# Patient Record
Sex: Female | Born: 1975 | Race: Black or African American | Hispanic: No | Marital: Single | Smoking: Never smoker
Health system: Southern US, Community
[De-identification: ages and names within clinical notes are randomized; demographics above are authoritative.]

## PROBLEM LIST (undated history)

## (undated) HISTORY — PX: CHOLECYSTECTOMY: SHX55

## (undated) HISTORY — PX: HERNIA REPAIR: SHX51

## (undated) HISTORY — PX: COSMETIC SURGERY: SHX468

---

## 2004-12-02 ENCOUNTER — Emergency Department: Payer: Self-pay | Admitting: Emergency Medicine

## 2004-12-12 ENCOUNTER — Emergency Department: Payer: Self-pay | Admitting: General Practice

## 2007-06-19 ENCOUNTER — Emergency Department: Payer: Self-pay

## 2007-06-23 ENCOUNTER — Ambulatory Visit: Payer: Self-pay

## 2007-07-02 ENCOUNTER — Ambulatory Visit: Payer: Self-pay | Admitting: Obstetrics & Gynecology

## 2007-11-01 ENCOUNTER — Emergency Department: Payer: Self-pay | Admitting: Unknown Physician Specialty

## 2007-11-01 ENCOUNTER — Other Ambulatory Visit: Payer: Self-pay

## 2008-01-24 ENCOUNTER — Emergency Department: Payer: Self-pay | Admitting: Emergency Medicine

## 2008-02-25 ENCOUNTER — Observation Stay: Payer: Self-pay

## 2008-03-14 ENCOUNTER — Emergency Department: Payer: Self-pay | Admitting: Emergency Medicine

## 2008-03-28 ENCOUNTER — Observation Stay: Payer: Self-pay

## 2008-04-07 ENCOUNTER — Observation Stay: Payer: Self-pay | Admitting: Obstetrics and Gynecology

## 2008-04-18 ENCOUNTER — Observation Stay: Payer: Self-pay | Admitting: Obstetrics and Gynecology

## 2008-04-23 ENCOUNTER — Inpatient Hospital Stay: Payer: Self-pay | Admitting: Obstetrics and Gynecology

## 2009-07-20 ENCOUNTER — Ambulatory Visit: Payer: Self-pay | Admitting: Surgery

## 2009-07-30 ENCOUNTER — Ambulatory Visit: Payer: Self-pay | Admitting: Surgery

## 2013-05-19 ENCOUNTER — Emergency Department: Payer: Self-pay | Admitting: Emergency Medicine

## 2013-05-19 LAB — RAPID INFLUENZA A&B ANTIGENS

## 2017-10-10 ENCOUNTER — Other Ambulatory Visit: Payer: Self-pay | Admitting: Family Medicine

## 2017-10-10 DIAGNOSIS — Z1231 Encounter for screening mammogram for malignant neoplasm of breast: Secondary | ICD-10-CM

## 2017-10-24 ENCOUNTER — Ambulatory Visit: Payer: Self-pay

## 2017-10-24 ENCOUNTER — Ambulatory Visit
Admission: RE | Admit: 2017-10-24 | Discharge: 2017-10-24 | Disposition: A | Discharge status: Home / Self Care | Payer: Medicaid Other | Source: Ambulatory Visit | Attending: Family Medicine | Admitting: Family Medicine

## 2017-10-24 DIAGNOSIS — Z1231 Encounter for screening mammogram for malignant neoplasm of breast: Secondary | ICD-10-CM | POA: Diagnosis not present

## 2019-03-20 DIAGNOSIS — H52223 Regular astigmatism, bilateral: Secondary | ICD-10-CM | POA: Diagnosis not present

## 2019-04-02 DIAGNOSIS — Z124 Encounter for screening for malignant neoplasm of cervix: Secondary | ICD-10-CM | POA: Diagnosis not present

## 2019-04-02 DIAGNOSIS — Z975 Presence of (intrauterine) contraceptive device: Secondary | ICD-10-CM | POA: Diagnosis not present

## 2019-04-02 DIAGNOSIS — R5383 Other fatigue: Secondary | ICD-10-CM | POA: Diagnosis not present

## 2019-04-02 DIAGNOSIS — Z0000 Encounter for general adult medical examination without abnormal findings: Secondary | ICD-10-CM | POA: Diagnosis not present

## 2019-04-02 DIAGNOSIS — Z1231 Encounter for screening mammogram for malignant neoplasm of breast: Secondary | ICD-10-CM | POA: Diagnosis not present

## 2019-04-02 DIAGNOSIS — Z114 Encounter for screening for human immunodeficiency virus [HIV]: Secondary | ICD-10-CM | POA: Diagnosis not present

## 2019-04-02 DIAGNOSIS — Z1159 Encounter for screening for other viral diseases: Secondary | ICD-10-CM | POA: Diagnosis not present

## 2019-04-07 DIAGNOSIS — Z419 Encounter for procedure for purposes other than remedying health state, unspecified: Secondary | ICD-10-CM | POA: Diagnosis not present

## 2019-04-14 ENCOUNTER — Other Ambulatory Visit: Payer: Self-pay | Admitting: Family Medicine

## 2019-04-14 DIAGNOSIS — Z1231 Encounter for screening mammogram for malignant neoplasm of breast: Secondary | ICD-10-CM

## 2019-04-15 ENCOUNTER — Other Ambulatory Visit: Payer: Self-pay

## 2019-04-15 ENCOUNTER — Ambulatory Visit
Admission: RE | Admit: 2019-04-15 | Discharge: 2019-04-15 | Disposition: A | Discharge status: Home / Self Care | Payer: 59 | Source: Ambulatory Visit | Attending: Family Medicine | Admitting: Family Medicine

## 2019-04-15 DIAGNOSIS — Z1231 Encounter for screening mammogram for malignant neoplasm of breast: Secondary | ICD-10-CM | POA: Insufficient documentation

## 2019-04-18 DIAGNOSIS — Z20828 Contact with and (suspected) exposure to other viral communicable diseases: Secondary | ICD-10-CM | POA: Diagnosis not present

## 2019-05-14 DIAGNOSIS — L7633 Postprocedural seroma of skin and subcutaneous tissue following a dermatologic procedure: Secondary | ICD-10-CM | POA: Diagnosis not present

## 2019-05-14 DIAGNOSIS — S31109A Unspecified open wound of abdominal wall, unspecified quadrant without penetration into peritoneal cavity, initial encounter: Secondary | ICD-10-CM | POA: Diagnosis not present

## 2019-05-14 DIAGNOSIS — R109 Unspecified abdominal pain: Secondary | ICD-10-CM | POA: Diagnosis not present

## 2019-05-14 DIAGNOSIS — L7634 Postprocedural seroma of skin and subcutaneous tissue following other procedure: Secondary | ICD-10-CM | POA: Diagnosis not present

## 2019-05-14 DIAGNOSIS — T8131XA Disruption of external operation (surgical) wound, not elsewhere classified, initial encounter: Secondary | ICD-10-CM | POA: Diagnosis not present

## 2019-05-18 ENCOUNTER — Encounter: Payer: Self-pay | Admitting: Emergency Medicine

## 2019-05-18 ENCOUNTER — Other Ambulatory Visit: Payer: Self-pay

## 2019-05-18 ENCOUNTER — Ambulatory Visit
Admission: EM | Admit: 2019-05-18 | Discharge: 2019-05-18 | Disposition: A | Discharge status: Home / Self Care | Payer: 59 | Attending: Family Medicine | Admitting: Family Medicine

## 2019-05-18 DIAGNOSIS — R059 Cough, unspecified: Secondary | ICD-10-CM

## 2019-05-18 DIAGNOSIS — Z825 Family history of asthma and other chronic lower respiratory diseases: Secondary | ICD-10-CM | POA: Insufficient documentation

## 2019-05-18 DIAGNOSIS — R05 Cough: Secondary | ICD-10-CM | POA: Insufficient documentation

## 2019-05-18 DIAGNOSIS — U071 COVID-19: Secondary | ICD-10-CM | POA: Insufficient documentation

## 2019-05-18 DIAGNOSIS — R52 Pain, unspecified: Secondary | ICD-10-CM | POA: Insufficient documentation

## 2019-05-18 DIAGNOSIS — R509 Fever, unspecified: Secondary | ICD-10-CM | POA: Insufficient documentation

## 2019-05-18 DIAGNOSIS — R5383 Other fatigue: Secondary | ICD-10-CM | POA: Diagnosis not present

## 2019-05-18 DIAGNOSIS — Z8249 Family history of ischemic heart disease and other diseases of the circulatory system: Secondary | ICD-10-CM | POA: Insufficient documentation

## 2019-05-18 DIAGNOSIS — R519 Headache, unspecified: Secondary | ICD-10-CM

## 2019-05-18 DIAGNOSIS — Z833 Family history of diabetes mellitus: Secondary | ICD-10-CM | POA: Insufficient documentation

## 2019-05-18 DIAGNOSIS — M791 Myalgia, unspecified site: Secondary | ICD-10-CM

## 2019-05-18 DIAGNOSIS — R0981 Nasal congestion: Secondary | ICD-10-CM

## 2019-05-18 DIAGNOSIS — J029 Acute pharyngitis, unspecified: Secondary | ICD-10-CM

## 2019-05-18 NOTE — ED Triage Notes (Addendum)
Patient in today c/o cough, nausea, headache, body aches and fever (100) x 1 day. Patient's last dose of Tylenol was ~6:45am today. Patient's last covid test (through work) was 5 days ago and it was negative.

## 2019-05-18 NOTE — ED Provider Notes (Signed)
[facility]    CSN: [id number] Arrival date & time: 05/18/19  1153      History   Chief Complaint Chief Complaint  Patient presents with  . Cough    APPT  . Fever  . Nausea    HPI Sarah Wilson is a 44 y.o. female.   44 year old female presents with body aches, nausea, headache, chills and low grade fever that started yesterday. Also having a dry cough. Minimal nasal congestion. No vomiting or diarrhea. Recently traveled to [place] to have an abdominoplasty (tummy tuck). Was seen at [facility] ER on 05/14/2019 ( 5 days ago) for concern over drainage from incision. Various tests along with CT Scans were performed and determined that no infection was present. Incision healing better now. Was tested for COVID 19 5 days ago through work which was negative but concerned over possible exposure at the ER. Has been taking Tylenol with some relief.  Other chronic health issues include environmental allergies and takes Zyrtec daily. Also on Mirena IUD and takes daily vitamins.   The history is provided by the patient.    History reviewed. No pertinent past medical history.  There are no problems to display for this patient.   Past Surgical History:  Procedure Laterality Date  . CHOLECYSTECTOMY    . COSMETIC SURGERY     tummy tuck  . HERNIA REPAIR      OB History   No obstetric history on file.      Home Medications    Prior to Admission medications   Medication Sig Start Date End Date Taking? Authorizing Provider  Cetirizine HCl 10 MG TBDP Take by mouth. 03/14/12  Yes [provider]  levonorgestrel Toney Reil) 20 MCG/24HR IUD placed in 05/2008 08/11/10  Yes [provider]    Family History Family History  Problem Relation Age of Onset  . Breast cancer Maternal Grandmother 60  . Asthma Mother   . Diabetes Mother   . Hypertension Father     Social History Social History   Tobacco Use  . Smoking status: Never Smoker  . Smokeless  tobacco: Never Used  Substance Use Topics  . Alcohol use: Yes    Comment: ocassional  . Drug use: Never     Allergies   Tape   Review of Systems Review of Systems  Constitutional: Positive for appetite change, chills, fatigue and fever. Negative for activity change and diaphoresis.  HENT: Positive for congestion, postnasal drip, rhinorrhea and sore throat (irritated). Negative for ear discharge, ear pain, facial swelling, mouth sores, nosebleeds, sinus pressure, sinus pain and trouble swallowing.   Eyes: Negative for pain, discharge, redness and itching.  Respiratory: Positive for cough. Negative for chest tightness, shortness of breath and wheezing.   Gastrointestinal: Positive for nausea. Negative for abdominal pain, diarrhea and vomiting.  Musculoskeletal: Positive for arthralgias and myalgias. Negative for neck pain and neck stiffness.  Skin: Positive for wound (healing). Negative for color change and rash.  Allergic/Immunologic: Positive for environmental allergies. Negative for food allergies.  Neurological: Positive for headaches. Negative for dizziness, tremors, seizures, syncope, weakness, light-headedness and numbness.  Hematological: Negative for adenopathy. Does not bruise/bleed easily.     Physical Exam Triage Vital Signs ED Triage Vitals  Enc Vitals Group     BP 05/18/19 1204 (!) 147/90     Pulse Rate 05/18/19 1204 95     Resp 05/18/19 1204 18     Temp 05/18/19 1204 99.5 F (37.5 C)  Temp Source 05/18/19 1204 Oral     SpO2 05/18/19 1204 100 %     Weight 05/18/19 1205 175 lb (79.4 kg)     Height 05/18/19 1205 5\' 4"  (1.626 m)     Head Circumference --      Peak Flow --      Pain Score 05/18/19 1204 4     Pain Loc --      Pain Edu? --      Excl. in [place]? --    No data found.  Updated Vital Signs BP (!) 147/90 (BP Location: Left Arm)   Pulse 95   Temp 99.5 F (37.5 C) (Oral)   Resp 18   Ht 5\' 4"  (1.626 m)   Wt 175 lb (79.4 kg)   SpO2 100%   BMI  30.04 kg/m   Visual Acuity Right Eye Distance:   Left Eye Distance:   Bilateral Distance:    Right Eye Near:   Left Eye Near:    Bilateral Near:     Physical Exam Vitals and nursing note reviewed.  Constitutional:      General: She is awake. She is not in acute distress.    Appearance: She is well-developed and well-groomed. She is ill-appearing.     Comments: Patient sitting comfortably in exam chair but appears ill.   HENT:     Head: Normocephalic and atraumatic.     Right Ear: Hearing, tympanic membrane, ear canal and external ear normal.     Left Ear: Hearing, tympanic membrane, ear canal and external ear normal.     Nose: Congestion and rhinorrhea present. Rhinorrhea is clear.     Right Sinus: No maxillary sinus tenderness or frontal sinus tenderness.     Left Sinus: No maxillary sinus tenderness or frontal sinus tenderness.     Mouth/Throat:     Lips: Pink.     Mouth: Mucous membranes are moist.     Pharynx: Oropharynx is clear. Uvula midline. Posterior oropharyngeal erythema present. No pharyngeal swelling, oropharyngeal exudate or uvula swelling.  Eyes:     Extraocular Movements: Extraocular movements intact.     Conjunctiva/sclera: Conjunctivae normal.  Cardiovascular:     Rate and Rhythm: Normal rate and regular rhythm.     Heart sounds: Normal heart sounds. No murmur.  Pulmonary:     Effort: Pulmonary effort is normal. No respiratory distress.     Breath sounds: Normal breath sounds and air entry. No decreased air movement. No decreased breath sounds, wheezing, rhonchi or rales.  Abdominal:     General: Abdomen is flat. Bowel sounds are normal.     Palpations: Abdomen is soft.     Comments: Patient had abdominal band in place- did not remove to evaluate previous wound since patient indicated that wound was improving with minimal pain.   Musculoskeletal:        General: Normal range of motion.     Cervical back: Normal range of motion and neck supple. No  rigidity.  Lymphadenopathy:     Cervical: No cervical adenopathy.  Skin:    General: Skin is warm and dry.     Capillary Refill: Capillary refill takes less than 2 seconds.  Neurological:     General: No focal deficit present.     Mental Status: She is alert and oriented to person, place, and time.  Psychiatric:        Mood and Affect: Mood normal.        Behavior: Behavior normal. Behavior is  cooperative.        Thought Content: Thought content normal.        Judgment: Judgment normal.      UC Treatments / Results  Labs (all labs ordered are listed, but only abnormal results are displayed) Labs Reviewed  NOVEL CORONAVIRUS, NAA (HOSP ORDER, SEND-OUT TO REF LAB; TAT 18-24 HRS)    EKG   Radiology No results found.  Procedures Procedures (including critical care time)  Medications Ordered in UC Medications - No data to display  Initial Impression / Assessment and Plan / UC Course  I have reviewed the triage vital signs and the nursing notes.  Pertinent labs & imaging results that were available during my care of the patient were reviewed by me and considered in my medical decision making (see chart for details).    Discussed with patient that she probably has a viral illness. Do not believe she is septic from previous wound, especially since cough is main concern and her wound is healing and abdominal pain is minimal. Will continue to monitor symptoms. Recommend continue Tylenol 1000mg  every 8 hours as needed for fever and body aches. May alternate with Ibuprofen 600mg  every 8 to 12 hours as needed. Continue to push fluids. Rest. Stay at home. Follow-up pending COVID 19 test results or go back to the ER if symptoms worsen.   Final Clinical Impressions(s) / UC Diagnoses   Final diagnoses:  Generalized body aches  Cough  Chills with fever     Discharge Instructions     Recommend continue Tylenol 1000mg  every 8 hours as needed for fever and body aches. May also take  Ibuprofen 600mg  every 8 to 12 hours as needed. Continue to push fluids. Rest. Stay at home. Follow-up pending COVID 19 test results.     ED Prescriptions    None     PDMP not reviewed this encounter.   , NP 05/19/19 1042

## 2019-05-18 NOTE — Discharge Instructions (Addendum)
Recommend continue Tylenol 1000mg  every 8 hours as needed for fever and body aches. May also take Ibuprofen 600mg  every 8 to 12 hours as needed. Continue to push fluids. Rest. Stay at home. Follow-up pending COVID 19 test results.

## 2019-05-20 ENCOUNTER — Telehealth: Payer: Self-pay | Admitting: Emergency Medicine

## 2019-05-20 NOTE — Telephone Encounter (Signed)

## 2019-05-21 LAB — NOVEL CORONAVIRUS, NAA (HOSP ORDER, SEND-OUT TO REF LAB; TAT 18-24 HRS): SARS-CoV-2, NAA: DETECTED — AB

## 2019-05-27 DIAGNOSIS — Z6830 Body mass index (BMI) 30.0-30.9, adult: Secondary | ICD-10-CM | POA: Diagnosis not present

## 2019-05-27 DIAGNOSIS — T8130XA Disruption of wound, unspecified, initial encounter: Secondary | ICD-10-CM | POA: Diagnosis not present

## 2019-05-27 DIAGNOSIS — L7634 Postprocedural seroma of skin and subcutaneous tissue following other procedure: Secondary | ICD-10-CM | POA: Diagnosis not present

## 2019-06-19 DIAGNOSIS — Z113 Encounter for screening for infections with a predominantly sexual mode of transmission: Secondary | ICD-10-CM | POA: Diagnosis not present

## 2019-06-19 DIAGNOSIS — Z30430 Encounter for insertion of intrauterine contraceptive device: Secondary | ICD-10-CM | POA: Diagnosis not present

## 2019-08-18 DIAGNOSIS — Z20828 Contact with and (suspected) exposure to other viral communicable diseases: Secondary | ICD-10-CM | POA: Diagnosis not present

## 2019-08-25 DIAGNOSIS — Z20828 Contact with and (suspected) exposure to other viral communicable diseases: Secondary | ICD-10-CM | POA: Diagnosis not present

## 2019-09-01 DIAGNOSIS — Z20828 Contact with and (suspected) exposure to other viral communicable diseases: Secondary | ICD-10-CM | POA: Diagnosis not present

## 2019-09-08 DIAGNOSIS — Z20828 Contact with and (suspected) exposure to other viral communicable diseases: Secondary | ICD-10-CM | POA: Diagnosis not present

## 2019-09-15 DIAGNOSIS — Z20828 Contact with and (suspected) exposure to other viral communicable diseases: Secondary | ICD-10-CM | POA: Diagnosis not present

## 2019-10-07 ENCOUNTER — Ambulatory Visit: Payer: 59 | Attending: Internal Medicine

## 2019-10-07 DIAGNOSIS — Z23 Encounter for immunization: Secondary | ICD-10-CM

## 2019-10-07 NOTE — Progress Notes (Signed)
   Covid-19 Vaccination Clinic  Name:  Sarah Wilson    MRN: [id number] DOB: 1975/08/18  10/07/2019  Ms. Sarah Wilson was observed post Covid-19 immunization for 15 minutes without incident. She was provided with Vaccine Information Sheet and instruction to access the V-Safe system.   Ms. Sarah Wilson was instructed to call 911 with any severe reactions post vaccine: Marland Kitchen Difficulty breathing  . Swelling of face and throat  . A fast heartbeat  . A bad rash all over body  . Dizziness and weakness   Immunizations Administered    Name Date Dose VIS Date Route   Pfizer COVID-19 Vaccine 10/07/2019  8:50 AM 0.3 mL 07/09/2018 Intramuscular   Manufacturer: [employer], [employer]   Lot: [id number]   NDC: [address]-1

## 2019-10-28 ENCOUNTER — Ambulatory Visit: Payer: 59 | Attending: Internal Medicine

## 2019-10-28 DIAGNOSIS — Z23 Encounter for immunization: Secondary | ICD-10-CM

## 2019-10-28 NOTE — Progress Notes (Signed)
   Covid-19 Vaccination Clinic  Name:  Sarah Wilson    MRN: [id number] DOB: [birth year]  10/28/2019  Sarah Wilson was observed post Covid-19 immunization for 15 minutes without incident. She was provided with Vaccine Information Sheet and instruction to access the V-Safe system.   Sarah Wilson was instructed to call 911 with any severe reactions post vaccine: Marland Kitchen Difficulty breathing  . Swelling of face and throat  . A fast heartbeat  . A bad rash all over body  . Dizziness and weakness   Immunizations Administered    Name Date Dose VIS Date Route   Pfizer COVID-19 Vaccine 10/28/2019  8:27 AM 0.3 mL 07/09/2018 Intramuscular   Manufacturer: [employer], [employer]   Lot: [id number]   NDC: [address]-4

## 2019-11-24 DIAGNOSIS — Z6832 Body mass index (BMI) 32.0-32.9, adult: Secondary | ICD-10-CM | POA: Diagnosis not present

## 2019-11-24 DIAGNOSIS — Z01812 Encounter for preprocedural laboratory examination: Secondary | ICD-10-CM | POA: Diagnosis not present

## 2019-11-24 DIAGNOSIS — R001 Bradycardia, unspecified: Secondary | ICD-10-CM | POA: Diagnosis not present

## 2019-11-24 DIAGNOSIS — Z419 Encounter for procedure for purposes other than remedying health state, unspecified: Secondary | ICD-10-CM | POA: Diagnosis not present

## 2019-11-24 DIAGNOSIS — Z20822 Contact with and (suspected) exposure to covid-19: Secondary | ICD-10-CM | POA: Diagnosis not present

## 2019-11-24 DIAGNOSIS — Z114 Encounter for screening for human immunodeficiency virus [HIV]: Secondary | ICD-10-CM | POA: Diagnosis not present

## 2019-12-11 DIAGNOSIS — Z20822 Contact with and (suspected) exposure to covid-19: Secondary | ICD-10-CM | POA: Diagnosis not present

## 2020-07-02 ENCOUNTER — Other Ambulatory Visit: Payer: Self-pay | Admitting: Family Medicine

## 2020-07-02 DIAGNOSIS — Z1231 Encounter for screening mammogram for malignant neoplasm of breast: Secondary | ICD-10-CM

## 2020-07-21 ENCOUNTER — Ambulatory Visit
Admission: RE | Admit: 2020-07-21 | Discharge: 2020-07-21 | Disposition: A | Discharge status: Home / Self Care | Payer: 59 | Source: Ambulatory Visit | Attending: Family Medicine | Admitting: Family Medicine

## 2020-07-21 ENCOUNTER — Other Ambulatory Visit: Payer: Self-pay

## 2020-07-21 DIAGNOSIS — Z1231 Encounter for screening mammogram for malignant neoplasm of breast: Secondary | ICD-10-CM | POA: Insufficient documentation

## 2020-11-17 DIAGNOSIS — R001 Bradycardia, unspecified: Secondary | ICD-10-CM | POA: Diagnosis not present

## 2020-11-17 DIAGNOSIS — Z0000 Encounter for general adult medical examination without abnormal findings: Secondary | ICD-10-CM | POA: Diagnosis not present

## 2020-11-17 DIAGNOSIS — Z419 Encounter for procedure for purposes other than remedying health state, unspecified: Secondary | ICD-10-CM | POA: Diagnosis not present

## 2020-11-23 DIAGNOSIS — Z419 Encounter for procedure for purposes other than remedying health state, unspecified: Secondary | ICD-10-CM | POA: Diagnosis not present

## 2020-12-04 DIAGNOSIS — Z20822 Contact with and (suspected) exposure to covid-19: Secondary | ICD-10-CM | POA: Diagnosis not present

## 2021-03-22 DIAGNOSIS — J40 Bronchitis, not specified as acute or chronic: Secondary | ICD-10-CM | POA: Diagnosis not present

## 2021-03-22 DIAGNOSIS — R6889 Other general symptoms and signs: Secondary | ICD-10-CM | POA: Diagnosis not present

## 2021-03-22 DIAGNOSIS — J029 Acute pharyngitis, unspecified: Secondary | ICD-10-CM | POA: Diagnosis not present

## 2021-09-30 ENCOUNTER — Ambulatory Visit (LOCAL_COMMUNITY_HEALTH_CENTER): Payer: Self-pay

## 2021-09-30 DIAGNOSIS — Z111 Encounter for screening for respiratory tuberculosis: Secondary | ICD-10-CM

## 2021-10-03 ENCOUNTER — Ambulatory Visit (LOCAL_COMMUNITY_HEALTH_CENTER): Payer: 59

## 2021-10-03 DIAGNOSIS — Z111 Encounter for screening for respiratory tuberculosis: Secondary | ICD-10-CM

## 2021-10-03 LAB — TB SKIN TEST
Induration: 0 mm
TB Skin Test: NEGATIVE

## 2021-10-07 ENCOUNTER — Other Ambulatory Visit: Payer: 59

## 2021-10-18 IMAGING — MG MM DIGITAL SCREENING BILAT W/ TOMO AND CAD
8 series · 9 of 24 positions shown · non-contrast
Comparison: Previous exam(s).

CLINICAL DATA: Screening.

EXAM:
DIGITAL SCREENING BILATERAL MAMMOGRAM WITH TOMOSYNTHESIS AND CAD
TECHNIQUE: Bilateral screening digital craniocaudal and mediolateral oblique
mammograms were obtained. Bilateral screening digital breast
tomosynthesis was performed. The images were evaluated with
computer-aided detection.

[L CC synth-2D]
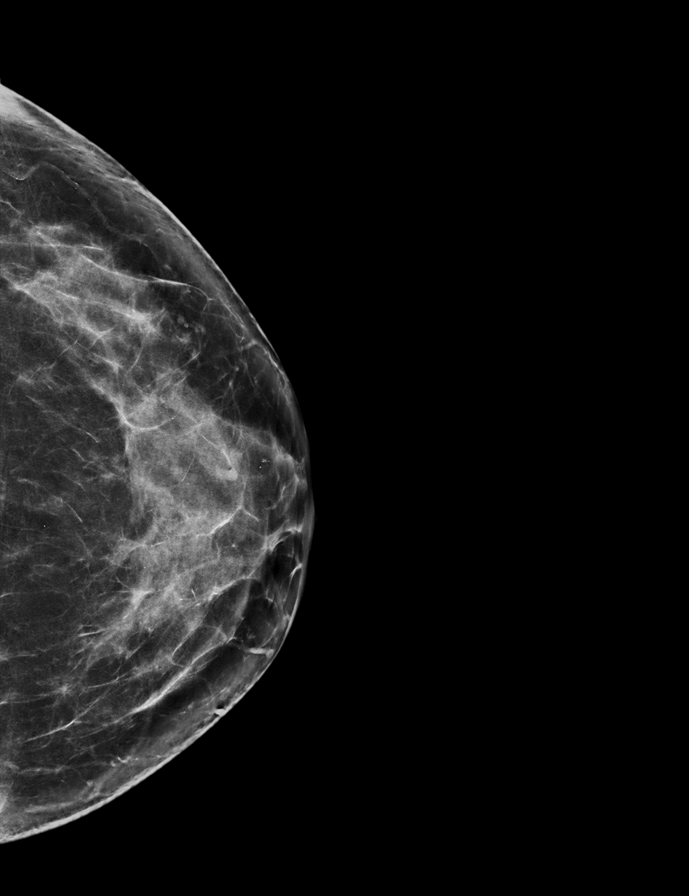

[L MLO synth-2D]
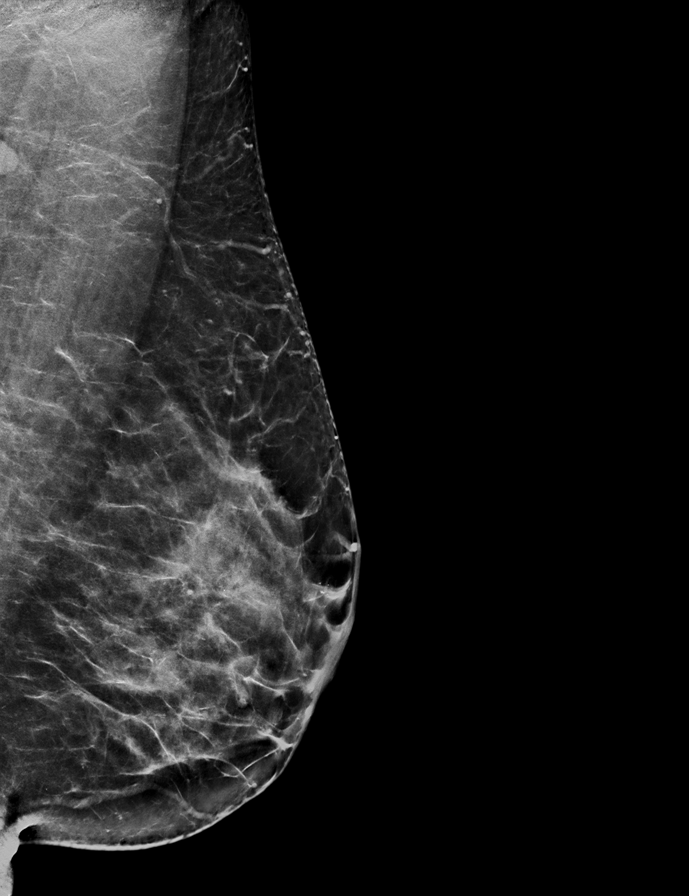

[R CC synth-2D]
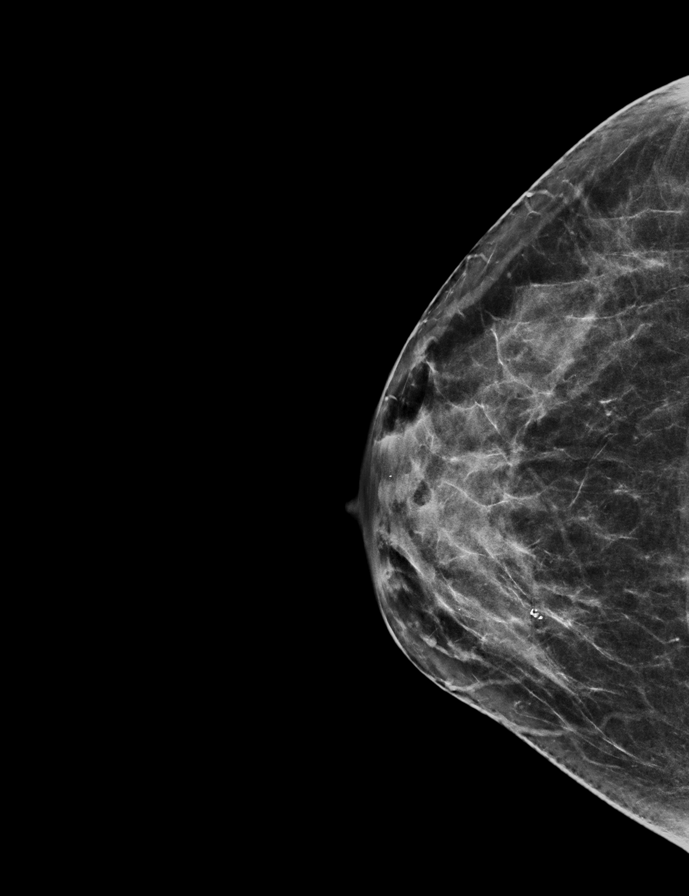

[R MLO synth-2D]
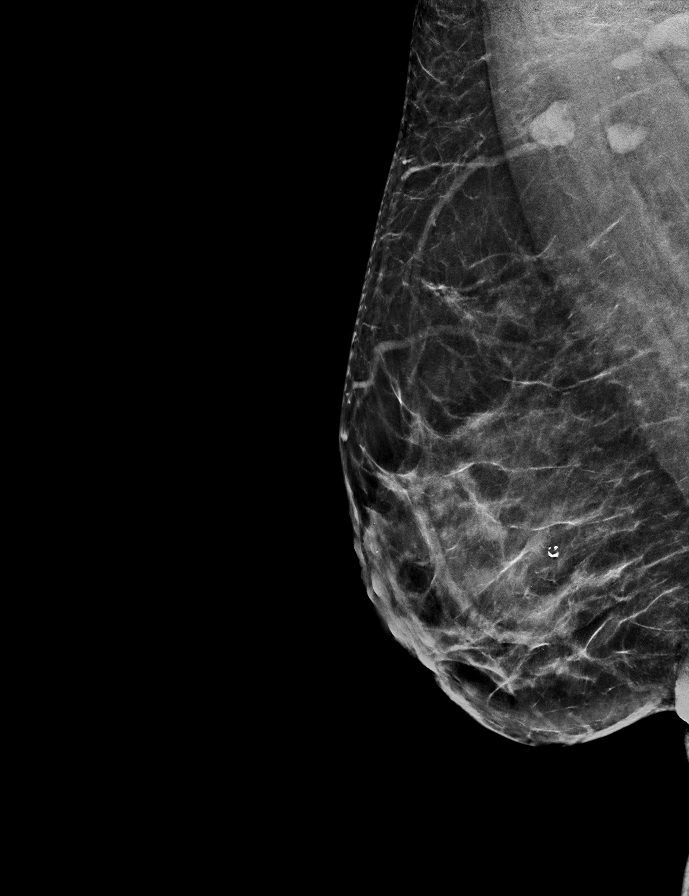

[R MLO tomo · 2 of 72 frames shown]
[frame 24/72]
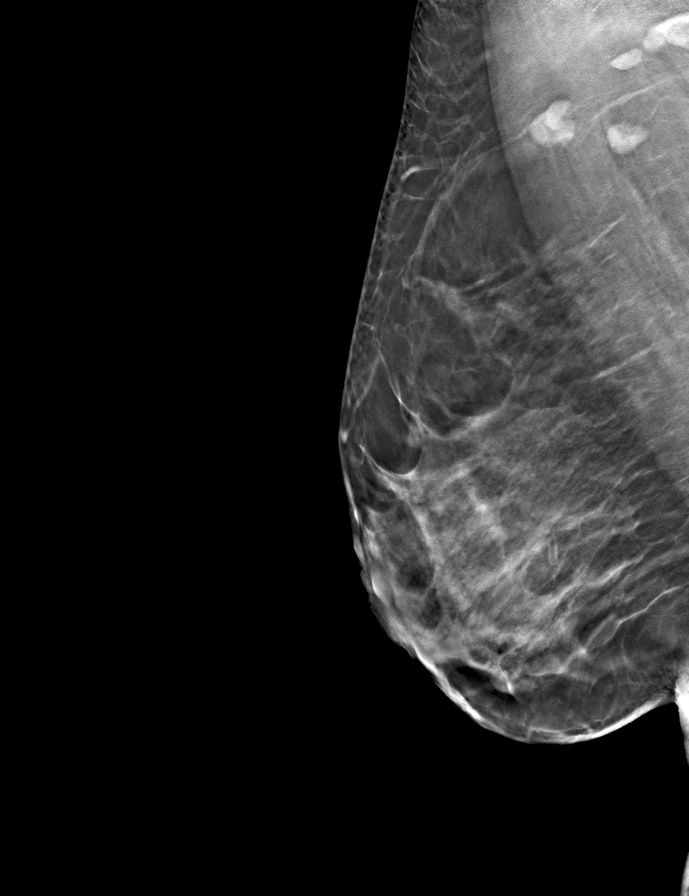
[frame 37/72]
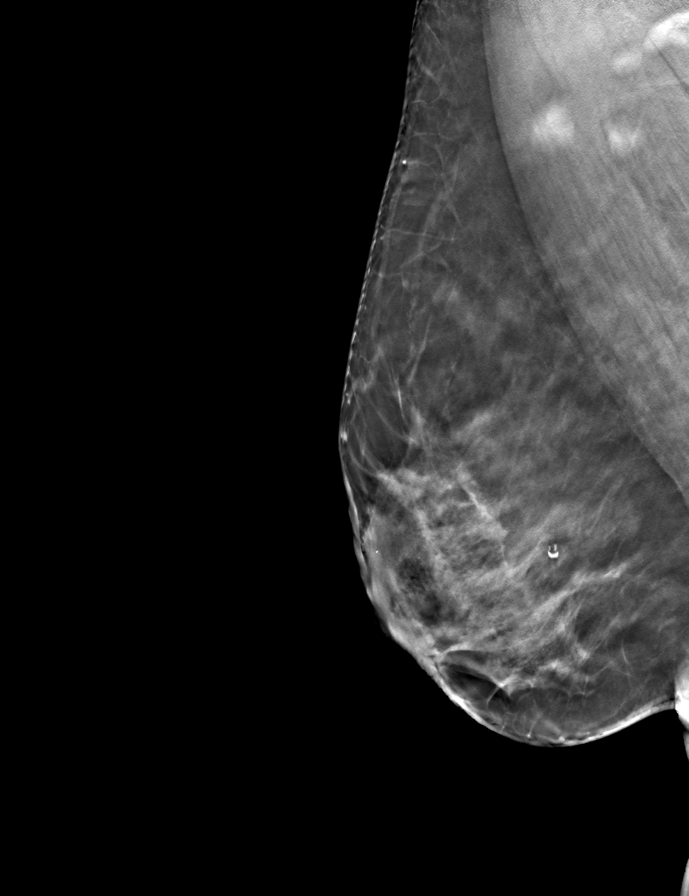

[L MLO tomo · tomo slice 37/74.0]
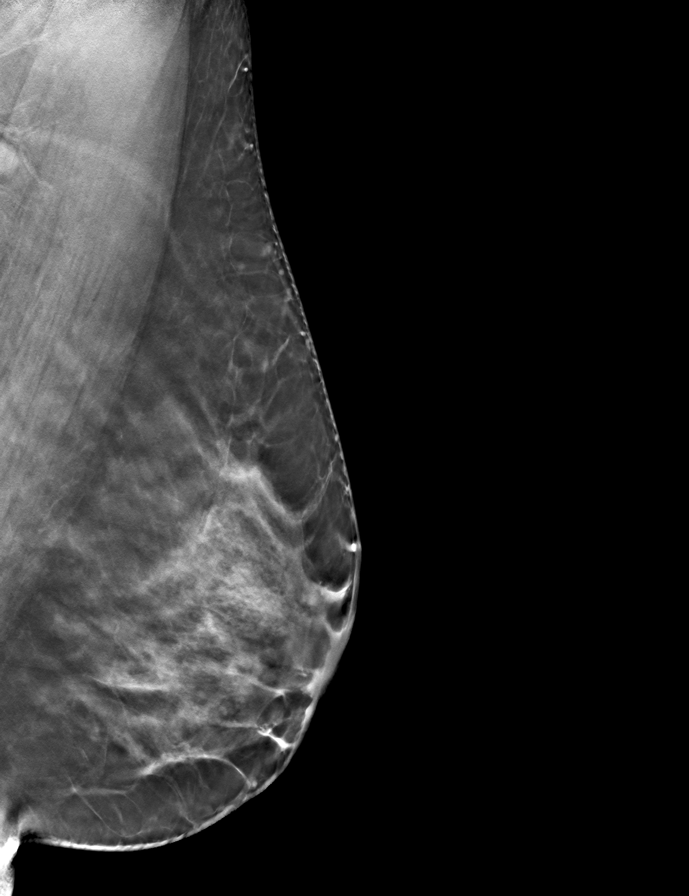

[L CC tomo · tomo slice 37/73.0]
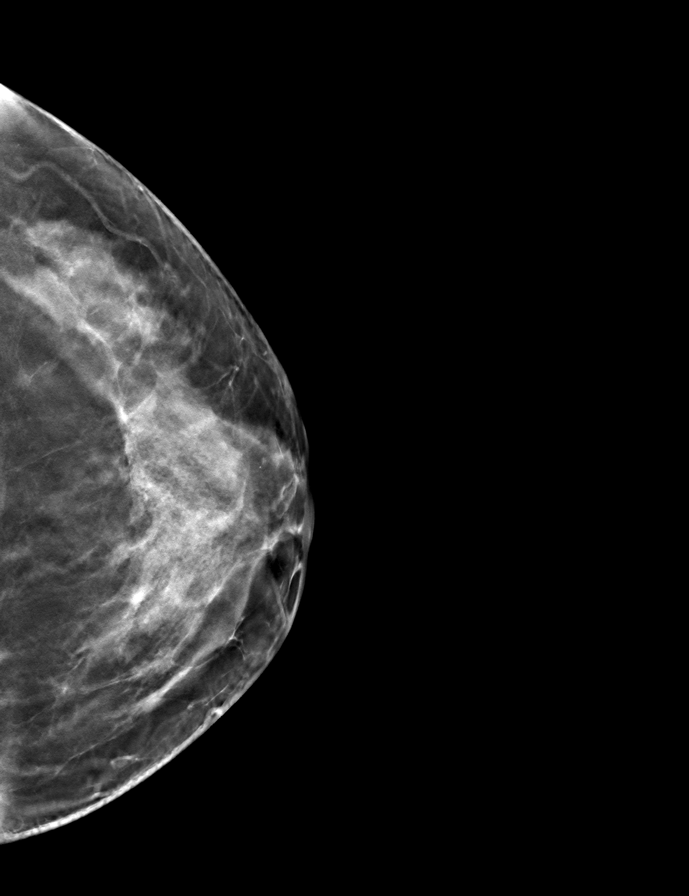

[R CC tomo · tomo slice 33/66.0]
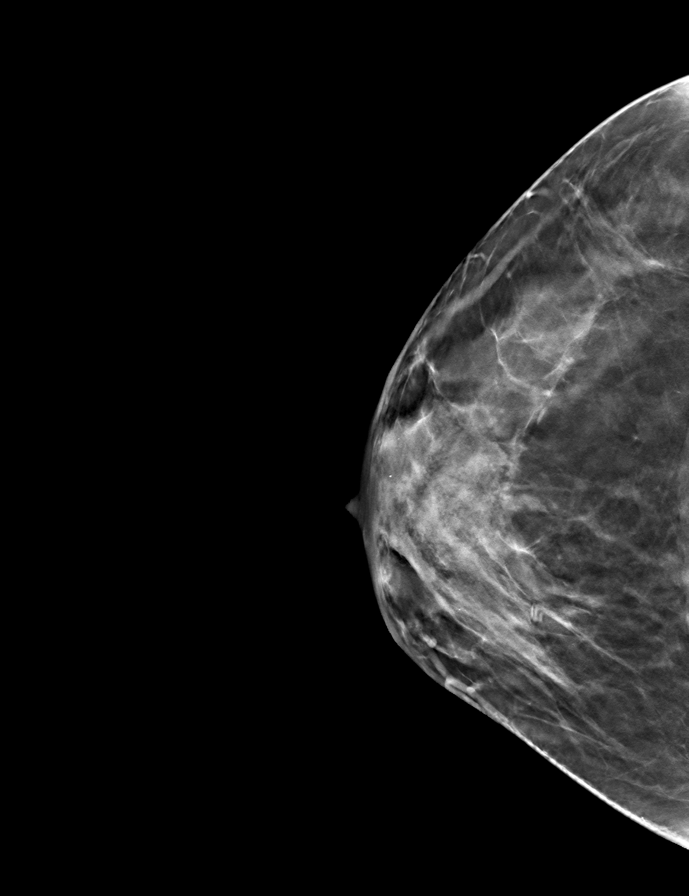

[9 of 24 positions shown; findings below may reference images not displayed]

ACR Breast Density Category c: The breast tissue is heterogeneously
dense, which may obscure small masses.
FINDINGS: There are no findings suspicious for malignancy. The images were
evaluated with computer-aided detection.
IMPRESSION: No mammographic evidence of malignancy. A result letter of this
screening mammogram will be mailed directly to the patient.

RECOMMENDATION:
Screening mammogram in one year. (Code:T4-5-GWO)

BI-RADS CATEGORY  1: Negative.

## 2021-10-27 DIAGNOSIS — H52223 Regular astigmatism, bilateral: Secondary | ICD-10-CM | POA: Diagnosis not present

## 2021-11-10 ENCOUNTER — Other Ambulatory Visit: Payer: Self-pay | Admitting: Family Medicine

## 2021-11-10 DIAGNOSIS — Z1231 Encounter for screening mammogram for malignant neoplasm of breast: Secondary | ICD-10-CM

## 2021-12-06 ENCOUNTER — Ambulatory Visit
Admission: RE | Admit: 2021-12-06 | Discharge: 2021-12-06 | Disposition: A | Discharge status: Home / Self Care | Payer: 59 | Source: Ambulatory Visit | Attending: Family Medicine | Admitting: Family Medicine

## 2021-12-06 DIAGNOSIS — Z1231 Encounter for screening mammogram for malignant neoplasm of breast: Secondary | ICD-10-CM | POA: Diagnosis not present

## 2022-01-04 DIAGNOSIS — Z833 Family history of diabetes mellitus: Secondary | ICD-10-CM | POA: Diagnosis not present

## 2022-01-04 DIAGNOSIS — Z1329 Encounter for screening for other suspected endocrine disorder: Secondary | ICD-10-CM | POA: Diagnosis not present

## 2022-01-04 DIAGNOSIS — Z23 Encounter for immunization: Secondary | ICD-10-CM | POA: Diagnosis not present

## 2022-01-04 DIAGNOSIS — Z13228 Encounter for screening for other metabolic disorders: Secondary | ICD-10-CM | POA: Diagnosis not present

## 2022-01-04 DIAGNOSIS — Z0000 Encounter for general adult medical examination without abnormal findings: Secondary | ICD-10-CM | POA: Diagnosis not present

## 2022-01-04 DIAGNOSIS — Z130 Encounter for screening for diseases of the blood and blood-forming organs and certain disorders involving the immune mechanism: Secondary | ICD-10-CM | POA: Diagnosis not present

## 2022-01-04 DIAGNOSIS — D573 Sickle-cell trait: Secondary | ICD-10-CM | POA: Diagnosis not present

## 2022-01-04 DIAGNOSIS — Z975 Presence of (intrauterine) contraceptive device: Secondary | ICD-10-CM | POA: Diagnosis not present

## 2022-01-04 DIAGNOSIS — Z13220 Encounter for screening for lipoid disorders: Secondary | ICD-10-CM | POA: Diagnosis not present

## 2022-01-04 DIAGNOSIS — Z1331 Encounter for screening for depression: Secondary | ICD-10-CM | POA: Diagnosis not present

## 2022-01-24 DIAGNOSIS — Z1211 Encounter for screening for malignant neoplasm of colon: Secondary | ICD-10-CM | POA: Diagnosis not present

## 2022-03-03 DIAGNOSIS — Z1151 Encounter for screening for human papillomavirus (HPV): Secondary | ICD-10-CM | POA: Diagnosis not present

## 2022-03-03 DIAGNOSIS — Z23 Encounter for immunization: Secondary | ICD-10-CM | POA: Diagnosis not present

## 2022-03-03 DIAGNOSIS — Z124 Encounter for screening for malignant neoplasm of cervix: Secondary | ICD-10-CM | POA: Diagnosis not present

## 2022-03-03 DIAGNOSIS — Z01419 Encounter for gynecological examination (general) (routine) without abnormal findings: Secondary | ICD-10-CM | POA: Diagnosis not present

## 2022-03-03 DIAGNOSIS — R030 Elevated blood-pressure reading, without diagnosis of hypertension: Secondary | ICD-10-CM | POA: Diagnosis not present

## 2023-02-07 ENCOUNTER — Other Ambulatory Visit: Payer: Self-pay | Admitting: Family Medicine

## 2023-02-07 DIAGNOSIS — Z1231 Encounter for screening mammogram for malignant neoplasm of breast: Secondary | ICD-10-CM

## 2023-02-22 ENCOUNTER — Ambulatory Visit
Admission: RE | Admit: 2023-02-22 | Discharge: 2023-02-22 | Disposition: A | Discharge status: Home / Self Care | Payer: BC Managed Care – PPO | Source: Ambulatory Visit | Attending: Family Medicine | Admitting: Family Medicine

## 2023-02-22 DIAGNOSIS — Z1231 Encounter for screening mammogram for malignant neoplasm of breast: Secondary | ICD-10-CM | POA: Diagnosis present

## 2023-02-26 ENCOUNTER — Encounter: Payer: Self-pay | Admitting: Family Medicine

## 2023-02-27 ENCOUNTER — Other Ambulatory Visit: Payer: Self-pay | Admitting: Family Medicine

## 2023-02-27 DIAGNOSIS — R928 Other abnormal and inconclusive findings on diagnostic imaging of breast: Secondary | ICD-10-CM

## 2023-03-01 ENCOUNTER — Ambulatory Visit
Admission: RE | Admit: 2023-03-01 | Discharge: 2023-03-01 | Disposition: A | Discharge status: Home / Self Care | Payer: BC Managed Care – PPO | Source: Ambulatory Visit | Attending: Family Medicine | Admitting: Family Medicine

## 2023-03-01 DIAGNOSIS — R928 Other abnormal and inconclusive findings on diagnostic imaging of breast: Secondary | ICD-10-CM | POA: Insufficient documentation
